# Patient Record
Sex: Male | Born: 1955 | Race: White | Hispanic: No | Marital: Married | Smoking: Former smoker
Health system: Southern US, Community
[De-identification: ages and names within clinical notes are randomized; demographics above are authoritative.]

## PROBLEM LIST (undated history)

## (undated) HISTORY — PX: EYE SURGERY: SHX253

---

## 2001-10-27 ENCOUNTER — Encounter: Admission: RE | Admit: 2001-10-27 | Discharge: 2001-10-27 | Payer: Self-pay | Admitting: Cardiology

## 2001-10-27 ENCOUNTER — Encounter: Payer: Self-pay | Admitting: Cardiology

## 2001-10-30 ENCOUNTER — Ambulatory Visit (HOSPITAL_COMMUNITY): Admission: RE | Admit: 2001-10-30 | Discharge: 2001-10-30 | Payer: Self-pay | Admitting: Cardiology

## 2005-03-07 ENCOUNTER — Ambulatory Visit: Payer: Self-pay | Admitting: Internal Medicine

## 2005-04-01 ENCOUNTER — Encounter (INDEPENDENT_AMBULATORY_CARE_PROVIDER_SITE_OTHER): Payer: Self-pay | Admitting: *Deleted

## 2005-04-01 ENCOUNTER — Ambulatory Visit: Payer: Self-pay | Admitting: Internal Medicine

## 2008-02-12 ENCOUNTER — Emergency Department (HOSPITAL_COMMUNITY): Admission: EM | Admit: 2008-02-12 | Discharge: 2008-02-12 | Payer: Self-pay | Admitting: Emergency Medicine

## 2009-06-12 ENCOUNTER — Encounter: Admission: RE | Admit: 2009-06-12 | Discharge: 2009-06-12 | Payer: Self-pay | Admitting: Occupational Medicine

## 2010-03-07 ENCOUNTER — Encounter (INDEPENDENT_AMBULATORY_CARE_PROVIDER_SITE_OTHER): Payer: Self-pay | Admitting: *Deleted

## 2010-10-02 NOTE — Letter (Signed)
Summary: Colonoscopy Date Change Letter  [facility]  [address] [place], [place] [address]   Phone: [phone]  Fax: [phone]      March 07, 2010 MRN: [id number]   Dylan Wilson [address] [place], [place]  [address]   Dear Dylan Wilson,   Previously you were recommended to have a repeat colonoscopy around this time. Your chart was recently reviewed by Dr. Hedwig Morton. Juanda Chance of  Gastroenterology. Follow up colonoscopy is now recommended in July 2013. This revised recommendation is based on current, nationally recognized guidelines for colorectal cancer screening and polyp surveillance. These guidelines are endorsed by the American Cancer Society, [employer] on Colorectal Cancer as well as numerous other major medical organizations.  Please understand that our recommendation assumes that you do not have any new symptoms such as bleeding, a change in bowel habits, anemia, or significant abdominal discomfort. If you do have any concerning GI symptoms or want to discuss the guideline recommendations, please call to arrange an office visit at your earliest convenience. Otherwise we will keep you in our reminder system and contact you 1-2 months prior to the date listed above to schedule your next colonoscopy.  Thank you,  Hedwig Morton. Juanda Chance, M.D.  [facility] [phone]

## 2011-01-18 NOTE — Cardiovascular Report (Signed)
[facility]  Patient:    RENAN, DANESE Visit Number: [id number] MRN: [id number]          Service Type: CAT Location: [facility] 2852 01 Attending Physician:  Loreli Dollar Dictated by:   Julieanne Manson, M.D. Proc. Date: 10/30/01 Admit Date:  10/30/2001   CC:         Elvina Sidle, M.D.  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  The exercise treadmill test was reported as positive with upsloping ST segment depression.  Because of this, he has been put on light duty until his cardiac status can be resolved.  PROCEDURES: 1. Left heart catheterization. 2. Selective right and left coronary arteriography. 3. Ventriculography in the RAO projection.  CARDIOLOGIST:  Thereasa Solo. Little, M.D.  COMPLICATIONS:  None.  DESCRIPTION OF PROCEDURE:  The patient was prepped and draped in the usual sterile fashion exposing the right groin, applying local anesthetic with 1% Xylocaine.  A Seldinger technique was employed and 5-French introducer sheath placed in the right femoral artery.  Selective left and right coronary arteriography and ventriculography in the RAO projection was performed.  RESULTS:  I.   HEMODYNAMIC MONITORING:  Central aortic pressure 117/72, left ventricular      pressure 120/8.   There was no significant aortic valve gradient noted at      the time of pullback.  II.  VENTRICULOGRAPHY: Ventriculography in the RAO projection revealed normal      left ventricular systolic function.  The ejection fraction was greater      than 60%.  The end-diastolic pressure was 15.  III. CORONARY ARTERIOGRAPHY:  On fluoroscope, no calcification was noted.      1. Left main normal.      2. LAD: The LAD extended down to the apex of the heart and gave rise to         one very large first  diagonal branch.  In addition to this, the septal         perforator was an extremely large vessel.  This entire system is free         of disease.      3. Circumflex: The Circumflex gave rise to a large OM that bifurcated.         This system was free of disease.      4. RCA: The RCA including the PDA and posterolateral branch was normal.  CONCLUSION: 1. Normal left ventricular systolic function. 2. No occlusive coronary disease.  DISCUSSION:  It appears that the stress test was a false positive study; 5-French catheters were used.  The patient should be ready for discharge to home in about four hours and can return to full duty in 72 hours. Dictated by:   Julieanne Manson, M.D. Attending Physician:  Loreli Dollar DD:  10/30/01 TD:  10/30/01 Job: [id number]

## 2011-03-08 ENCOUNTER — Encounter: Payer: Self-pay | Admitting: Family Medicine

## 2011-05-30 LAB — URINALYSIS, ROUTINE W REFLEX MICROSCOPIC
Bilirubin Urine: NEGATIVE
Ketones, ur: NEGATIVE
Specific Gravity, Urine: 1.028
Urobilinogen, UA: 1
pH: 6

## 2011-05-30 LAB — URINE CULTURE
Colony Count: NO GROWTH
Culture: NO GROWTH

## 2011-05-30 LAB — URINE MICROSCOPIC-ADD ON

## 2011-12-13 ENCOUNTER — Encounter: Payer: Self-pay | Admitting: Internal Medicine

## 2012-12-07 ENCOUNTER — Encounter: Payer: Self-pay | Admitting: Internal Medicine

## 2012-12-10 ENCOUNTER — Encounter: Payer: Self-pay | Admitting: Internal Medicine

## 2017-10-23 ENCOUNTER — Ambulatory Visit: Payer: Self-pay | Admitting: Family Medicine

## 2019-01-03 ENCOUNTER — Emergency Department (HOSPITAL_COMMUNITY): Payer: 59

## 2019-01-03 ENCOUNTER — Other Ambulatory Visit: Payer: Self-pay

## 2019-01-03 ENCOUNTER — Encounter (HOSPITAL_COMMUNITY): Payer: Self-pay | Admitting: Emergency Medicine

## 2019-01-03 ENCOUNTER — Emergency Department (HOSPITAL_COMMUNITY)
Admission: EM | Admit: 2019-01-03 | Discharge: 2019-01-04 | Disposition: A | Discharge status: Home / Self Care | Payer: 59 | Attending: Emergency Medicine | Admitting: Emergency Medicine

## 2019-01-03 DIAGNOSIS — Y9352 Activity, horseback riding: Secondary | ICD-10-CM | POA: Diagnosis not present

## 2019-01-03 DIAGNOSIS — S2249XA Multiple fractures of ribs, unspecified side, initial encounter for closed fracture: Secondary | ICD-10-CM

## 2019-01-03 DIAGNOSIS — S299XXA Unspecified injury of thorax, initial encounter: Secondary | ICD-10-CM | POA: Diagnosis present

## 2019-01-03 DIAGNOSIS — Y929 Unspecified place or not applicable: Secondary | ICD-10-CM | POA: Insufficient documentation

## 2019-01-03 DIAGNOSIS — S2242XA Multiple fractures of ribs, left side, initial encounter for closed fracture: Secondary | ICD-10-CM | POA: Insufficient documentation

## 2019-01-03 DIAGNOSIS — S22009A Unspecified fracture of unspecified thoracic vertebra, initial encounter for closed fracture: Secondary | ICD-10-CM | POA: Diagnosis not present

## 2019-01-03 DIAGNOSIS — R51 Headache: Secondary | ICD-10-CM | POA: Diagnosis not present

## 2019-01-03 DIAGNOSIS — S32009A Unspecified fracture of unspecified lumbar vertebra, initial encounter for closed fracture: Secondary | ICD-10-CM | POA: Insufficient documentation

## 2019-01-03 DIAGNOSIS — R1010 Upper abdominal pain, unspecified: Secondary | ICD-10-CM | POA: Insufficient documentation

## 2019-01-03 DIAGNOSIS — Y999 Unspecified external cause status: Secondary | ICD-10-CM | POA: Insufficient documentation

## 2019-01-03 DIAGNOSIS — Z87891 Personal history of nicotine dependence: Secondary | ICD-10-CM | POA: Diagnosis not present

## 2019-01-03 DIAGNOSIS — Z23 Encounter for immunization: Secondary | ICD-10-CM | POA: Insufficient documentation

## 2019-01-03 DIAGNOSIS — T1490XA Injury, unspecified, initial encounter: Secondary | ICD-10-CM

## 2019-01-03 DIAGNOSIS — W19XXXA Unspecified fall, initial encounter: Secondary | ICD-10-CM

## 2019-01-03 MED ORDER — TETANUS-DIPHTH-ACELL PERTUSSIS 5-2.5-18.5 LF-MCG/0.5 IM SUSP
0.5000 mL | Freq: Once | INTRAMUSCULAR | Status: AC
  Administered 2019-01-03: 0.5 mL via INTRAMUSCULAR
  Filled 2019-01-03: qty 0.5

## 2019-01-03 MED ORDER — FENTANYL CITRATE (PF) 100 MCG/2ML IJ SOLN
75.0000 ug | Freq: Once | INTRAMUSCULAR | Status: AC
  Administered 2019-01-03: 75 ug via INTRAVENOUS
  Filled 2019-01-03: qty 2

## 2019-01-03 NOTE — ED Triage Notes (Signed)
Brought from [facility] after being bucked off a horse trying to get on without a saddle.  Reports not remembering what happened.  C/O pain to left clavicle with deformity, abrasion to left upper arm and right rib pain that radiates into the back.

## 2019-01-03 NOTE — ED Provider Notes (Signed)
[facility] EMERGENCY DEPARTMENT Provider Note   CSN: [id number] Arrival date & time: 01/03/19  2152    History   Chief Complaint Chief Complaint  Patient presents with   Fall    HPI RODDRICK SHARRON is a 63 y.o. male with no pertinent past medical history who presents to the emergency department by EMS with a chief complaint of fall.  The patient reports that he was attempting to mount a horse without a saddle when he was bucked off of the horse that occurred just prior to arrival.  He fell backwards through the air and landed on the ground.  He reports that he was accompanied by his wife at the time and thinks that he had a syncopal episode as he does not recall the fall.  He reports the next thing he remembers is when EMS arrived.  He has not attempted to ambulate since the injury.  The patient's wife, Fannie Knee, reports that after the patient fell that he was laying on his back and moaning.  She reports that he "looked like he had the breath knocked out of him", and he was having increased work of breathing that lasted for approximately 2 minutes.  She reports that he appeared "a little clammy", which resolved as his breathing improved.  She denies LOC.  Reports when he firemen arrived that he knew his name, but when asked his age he reported0 "I do not even know."  In the ER, he endorses left shoulder pain, bilateral rib pain, upper abdominal pain, and left elbow pain.  Pain is constant and severe.  He denies numbness, weakness, visual changes, headache, nausea, vomiting, shortness of breath, back or neck pain, or pain in the bilateral lower extremities.  He was placed in a c-collar by EMS and was given 100 mcg of fentanyl in route.  He is unsure when his tetanus was last updated.  He does not take any blood thinners.     The history is provided by the patient. No language interpreter was used.    History reviewed. No pertinent past medical history.  There are no  active problems to display for this patient.   Past Surgical History:  Procedure Laterality Date   EYE SURGERY          Home Medications    Prior to Admission medications   Medication Sig Start Date End Date Taking? Authorizing Provider  lidocaine (LIDODERM) 5 % Place 1 patch onto the skin daily. Remove & Discard patch within 12 hours or as directed by MD 01/04/19   Misaki Sozio A, PA-C  methocarbamol (ROBAXIN) 500 MG tablet Take 1 tablet (500 mg total) by mouth 2 (two) times daily. 01/04/19   Ferris Tally A, PA-C  oxyCODONE-acetaminophen (PERCOCET/ROXICET) 5-325 MG tablet Take 1 tablet by mouth every 4 (four) hours as needed for up to 5 days for severe pain. 01/04/19 01/09/19  Angeliah Wisdom A, PA-C    Family History No family history on file.  Social History Social History   Tobacco Use   Smoking status: Former Smoker   Smokeless tobacco: Former Engineer, water Use Topics   Alcohol use: Never    Frequency: Never   Drug use: Never     Allergies   Patient has no known allergies.   Review of Systems Review of Systems  Constitutional: Negative for appetite change and fever.  HENT: Negative for congestion, sinus pressure and sinus pain.   Respiratory: Negative for shortness of breath.  Cardiovascular: Positive for chest pain. Negative for palpitations and leg swelling.  Gastrointestinal: Positive for abdominal pain. Negative for constipation, diarrhea, nausea and vomiting.  Genitourinary: Negative for dysuria.  Musculoskeletal: Positive for arthralgias and myalgias. Negative for back pain, joint swelling, neck pain and neck stiffness.  Skin: Negative for rash.  Allergic/Immunologic: Negative for immunocompromised state.  Neurological: Negative for dizziness, weakness, numbness and headaches.  Psychiatric/Behavioral: Negative for confusion.    Physical Exam Updated Vital Signs BP 131/85    Pulse 65    Temp 98.5 F (36.9 C) (Oral)    Resp 20    Ht  (1.778 m)     Wt 78.5 kg    SpO2 95%    BMI 24.82 kg/m   Physical Exam Vitals signs and nursing note reviewed.  Constitutional:      Appearance: He is well-developed.     Interventions: Cervical collar in place.  HENT:     Head: Normocephalic.  Eyes:     Extraocular Movements: Extraocular movements intact.     Conjunctiva/sclera: Conjunctivae normal.     Pupils: Pupils are equal, round, and reactive to light.  Neck:     Musculoskeletal: Neck supple.  Cardiovascular:     Rate and Rhythm: Normal rate and regular rhythm.     Heart sounds: No murmur.     Comments: Radial and DP pulses are 2+ and symmetric. Pulmonary:     Effort: Pulmonary effort is normal. No respiratory distress.     Breath sounds: No stridor. No wheezing, rhonchi or rales.     Comments: Significantly tender to palpation to the right inferior ribs in the anterior and lateral aspect.  He is also moderately tender diffusely to the left ribs.  No pain over the sternum.  No crepitus or step-offs.  Obvious deformity to the left clavicle.  No skin tenting. Chest:     Chest wall: Tenderness present.  Abdominal:     General: There is no distension.     Palpations: Abdomen is soft.     Tenderness: There is abdominal tenderness. There is no right CVA tenderness, left CVA tenderness, guarding or rebound.     Hernia: No hernia is present.     Comments: Tender to palpation in the left upper quadrant.  No right upper quadrant tenderness.  Lower abdomen is unremarkable.  No bruising or wounds noted to the abdominal wall.  Musculoskeletal:     Comments: No midline tenderness to the cervical spinous processes or bilateral paraspinal muscles.  Pelvis is nontender and stable.  No tenderness to palpation to the bilateral lower extremities.  Significantly tender to palpation over the left shoulder.  Left elbow is tender to palpation.  No tenderness to the left wrist.  Full active and passive range of motion of the right upper extremity.  Full range  of motion of the left wrist.  Range of motion of the left elbow and shoulder deferred secondary to pain.  Superficial abrasions overlying the left elbow and upper arm.  Wound is hemostatic.  Skin:    General: Skin is warm and dry.  Neurological:     Mental Status: He is alert.     Comments: Patient is oriented to name, year, and place.  Moves all 4 extremities.  Sensation is intact and equal throughout.  5/5 strength against resistance with dorsiflexion plantarflexion.  Good grip strength of the bilateral upper extremities.  Cranial nerves II through XII are grossly intact.  Speaks in complete, fluent sentences.  Gait exam is deferred at this time.  Psychiatric:        Behavior: Behavior normal.      ED Treatments / Results  Labs (all labs ordered are listed, but only abnormal results are displayed) Labs Reviewed  CK - Abnormal; Notable for the following components:      Result Value   Total CK 531 (*)    All other components within normal limits  CBC - Abnormal; Notable for the following components:   WBC 14.5 (*)    All other components within normal limits  COMPREHENSIVE METABOLIC PANEL - Abnormal; Notable for the following components:   Glucose, Bld 141 (*)    Calcium 8.6 (*)    Total Protein 6.3 (*)    All other components within normal limits    EKG EKG Interpretation  Date/Time:  Sunday Jan 03 2019 21:53:10 EDT Ventricular Rate:  68 PR Interval:    QRS Duration: 84 QT Interval:  392 QTC Calculation: 417 R Axis:   -23 Text Interpretation:  Sinus rhythm Borderline left axis deviation Low voltage, precordial leads Abnormal R-wave progression, early transition No old tracing to compare Confirmed by Dione Booze ([id number]) on 01/03/2019 11:26:04 PM   Radiology Dg Chest 2 View  Result Date: 01/03/2019 CLINICAL DATA:  Pain after fall. EXAM: CHEST - 2 VIEW COMPARISON:  June 12, 2009 FINDINGS: The heart size and mediastinal contours are within normal limits. Both lungs are  clear. The visualized skeletal structures are unremarkable. IMPRESSION: No active cardiopulmonary disease. Electronically Signed   By: Gerome Sam III M.D   On: 01/03/2019 23:18   Dg Clavicle Left  Result Date: 01/04/2019 CLINICAL DATA:  Left shoulder pain EXAM: LEFT CLAVICLE - 2+ VIEWS COMPARISON:  None. FINDINGS: Left AC joint appears mildly widened which could reflect AC joint separation. Glenohumeral joint is intact. No fracture. IMPRESSION: Mild widening of the left AC joint which could be related to [facility] joint separation. This could be further evaluated with bilateral AC joint study with and without weights if felt clinically indicated. Electronically Signed   By: Charlett Nose M.D.   On: 01/04/2019 00:06   Dg Elbow Complete Left  Result Date: 01/03/2019 CLINICAL DATA:  Pain after fall EXAM: LEFT ELBOW - COMPLETE 3+ VIEW COMPARISON:  None. FINDINGS: Lateral view is limited due to over flexion. No joint effusion noted. No fractures are seen. IMPRESSION: The study is limited due to over flexion on the lateral view. No fractures or dislocations identified. Electronically Signed   By: Gerome Sam III M.D   On: 01/03/2019 23:19   Ct Head Wo Contrast  Result Date: 01/03/2019 CLINICAL DATA:  Fall from horse. EXAM: CT HEAD WITHOUT CONTRAST CT CERVICAL SPINE WITHOUT CONTRAST TECHNIQUE: Multidetector CT imaging of the head and cervical spine was performed following the standard protocol without intravenous contrast. Multiplanar CT image reconstructions of the cervical spine were also generated. COMPARISON:  None. FINDINGS: CT HEAD FINDINGS Brain: No acute intracranial abnormality. Specifically, no hemorrhage, hydrocephalus, mass lesion, acute infarction, or significant intracranial injury. Vascular: No hyperdense vessel or unexpected calcification. Skull: No acute calvarial abnormality. Sinuses/Orbits: Visualized paranasal sinuses and mastoids clear. Orbital soft tissues unremarkable. Other: None CT  CERVICAL SPINE FINDINGS Alignment: Normal Skull base and vertebrae: No acute fracture. No primary bone lesion or focal pathologic process. Soft tissues and spinal canal: No prevertebral fluid or swelling. No visible canal hematoma. Disc levels:  Maintained Upper chest: Negative Other: None IMPRESSION: No intracranial abnormality. No bony abnormality  in the cervical spine. Electronically Signed   By: Charlett NoseKevin  Dover M.D.   On: 01/03/2019 22:54   Ct Chest W Contrast  Result Date: 01/04/2019 CLINICAL DATA:  Thrown from horse EXAM: CT CHEST, ABDOMEN, AND PELVIS WITH CONTRAST TECHNIQUE: Multidetector CT imaging of the chest, abdomen and pelvis was performed following the standard protocol during bolus administration of intravenous contrast. CONTRAST:  100mL OMNIPAQUE IOHEXOL 300 MG/ML  SOLN COMPARISON:  None. FINDINGS: CT CHEST FINDINGS Cardiovascular: Heart is normal size. Aorta is normal caliber. No evidence of aortic injury. Mediastinum/Nodes: No mediastinal, hilar, or axillary adenopathy. No mediastinal hematoma. Lungs/Pleura: . Lungs are clear. No focal airspace opacities or suspicious nodules. No effusions. No pneumothorax Musculoskeletal: Chest wall soft tissues are unremarkable. No acute bony abnormality. CT ABDOMEN PELVIS FINDINGS Hepatobiliary: No hepatic injury or perihepatic hematoma. Gallbladder is unremarkable Pancreas: No focal abnormality or ductal dilatation. Spleen: Small low-density lesions within the spleen, likely small cysts or hemangiomas. No splenic injury or perisplenic hematoma. Adrenals/Urinary Tract: No adrenal hemorrhage or renal injury identified. Bladder is unremarkable. Nonobstructing stones in the left kidney. No hydronephrosis. Stomach/Bowel: Normal appendix. Stomach, large and small bowel grossly unremarkable. Vascular/Lymphatic: No evidence of aneurysm or adenopathy. Reproductive: No visible focal abnormality. Other: No free fluid or free air. Musculoskeletal: No acute bony  abnormality. IMPRESSION: No acute findings or evidence of significant traumatic injury in the chest, abdomen or pelvis. Left nephrolithiasis.  No hydronephrosis. Electronically Signed   By: Charlett NoseKevin  Dover M.D.   On: 01/04/2019 02:02   Ct Cervical Spine Wo Contrast  Result Date: 01/03/2019 CLINICAL DATA:  Fall from horse. EXAM: CT HEAD WITHOUT CONTRAST CT CERVICAL SPINE WITHOUT CONTRAST TECHNIQUE: Multidetector CT imaging of the head and cervical spine was performed following the standard protocol without intravenous contrast. Multiplanar CT image reconstructions of the cervical spine were also generated. COMPARISON:  None. FINDINGS: CT HEAD FINDINGS Brain: No acute intracranial abnormality. Specifically, no hemorrhage, hydrocephalus, mass lesion, acute infarction, or significant intracranial injury. Vascular: No hyperdense vessel or unexpected calcification. Skull: No acute calvarial abnormality. Sinuses/Orbits: Visualized paranasal sinuses and mastoids clear. Orbital soft tissues unremarkable. Other: None CT CERVICAL SPINE FINDINGS Alignment: Normal Skull base and vertebrae: No acute fracture. No primary bone lesion or focal pathologic process. Soft tissues and spinal canal: No prevertebral fluid or swelling. No visible canal hematoma. Disc levels:  Maintained Upper chest: Negative Other: None IMPRESSION: No intracranial abnormality. No bony abnormality in the cervical spine. Electronically Signed   By: Charlett NoseKevin  Dover M.D.   On: 01/03/2019 22:54   Ct Abdomen Pelvis W Contrast  Result Date: 01/04/2019 CLINICAL DATA:  Thrown from horse EXAM: CT CHEST, ABDOMEN, AND PELVIS WITH CONTRAST TECHNIQUE: Multidetector CT imaging of the chest, abdomen and pelvis was performed following the standard protocol during bolus administration of intravenous contrast. CONTRAST:  100mL OMNIPAQUE IOHEXOL 300 MG/ML  SOLN COMPARISON:  None. FINDINGS: CT CHEST FINDINGS Cardiovascular: Heart is normal size. Aorta is normal caliber. No  evidence of aortic injury. Mediastinum/Nodes: No mediastinal, hilar, or axillary adenopathy. No mediastinal hematoma. Lungs/Pleura: . Lungs are clear. No focal airspace opacities or suspicious nodules. No effusions. No pneumothorax Musculoskeletal: Chest wall soft tissues are unremarkable. No acute bony abnormality. CT ABDOMEN PELVIS FINDINGS Hepatobiliary: No hepatic injury or perihepatic hematoma. Gallbladder is unremarkable Pancreas: No focal abnormality or ductal dilatation. Spleen: Small low-density lesions within the spleen, likely small cysts or hemangiomas. No splenic injury or perisplenic hematoma. Adrenals/Urinary Tract: No adrenal hemorrhage or renal injury identified. Bladder is  unremarkable. Nonobstructing stones in the left kidney. No hydronephrosis. Stomach/Bowel: Normal appendix. Stomach, large and small bowel grossly unremarkable. Vascular/Lymphatic: No evidence of aneurysm or adenopathy. Reproductive: No visible focal abnormality. Other: No free fluid or free air. Musculoskeletal: No acute bony abnormality. IMPRESSION: No acute findings or evidence of significant traumatic injury in the chest, abdomen or pelvis. Left nephrolithiasis.  No hydronephrosis. Electronically Signed   By: Charlett Nose M.D.   On: 01/04/2019 02:02   Ct T-spine No Charge  Result Date: 01/04/2019 CLINICAL DATA:  63 y/o  M; pain to back after fall from horse. EXAM: CT THORACIC AND LUMBAR SPINE WITHOUT CONTRAST TECHNIQUE: Multidetector CT imaging of the thoracic and lumbar spine was performed without contrast. Multiplanar CT image reconstructions were also generated. COMPARISON:  01/04/2019 CT of chest, abdomen, and pelvis. FINDINGS: CT THORACIC SPINE FINDINGS Alignment: Normal. Vertebrae: Left ribs 1-3 mildly displaced neck fractures. Left T2 and T3 transverse process nondisplaced fractures. No additional potential fracture identified. Paraspinal and other soft tissues: Negative. Disc levels: Negative. CT LUMBAR SPINE  FINDINGS Segmentation: 5 lumbar type vertebrae. Alignment: Normal. Vertebrae: Age-indeterminate minimal depression of the right superior endplate of L2 vertebral body. No additional potential fracture. Paraspinal and other soft tissues: Negative. Disc levels: Negative. IMPRESSION: 1. Left 1-3 rib mildly displaced neck fractures. 2. Left T2 and T3 transverse process nondisplaced fractures. 3. Age-indeterminate minimal depression of the right superior endplate of L2 vertebral body. 4. No additional potential acute fracture or dislocation identified. 5. Left-sided nephrolithiasis. These results were called by telephone at the time of interpretation on 01/04/2019 at 2:17 am to Dr. Pedro Earls [facility] , who verbally acknowledged these results. Electronically Signed   By: Mitzi Hansen M.D.   On: 01/04/2019 02:19   Ct L-spine No Charge  Result Date: 01/04/2019 CLINICAL DATA:  63 y/o  M; pain to back after fall from horse. EXAM: CT THORACIC AND LUMBAR SPINE WITHOUT CONTRAST TECHNIQUE: Multidetector CT imaging of the thoracic and lumbar spine was performed without contrast. Multiplanar CT image reconstructions were also generated. COMPARISON:  01/04/2019 CT of chest, abdomen, and pelvis. FINDINGS: CT THORACIC SPINE FINDINGS Alignment: Normal. Vertebrae: Left ribs 1-3 mildly displaced neck fractures. Left T2 and T3 transverse process nondisplaced fractures. No additional potential fracture identified. Paraspinal and other soft tissues: Negative. Disc levels: Negative. CT LUMBAR SPINE FINDINGS Segmentation: 5 lumbar type vertebrae. Alignment: Normal. Vertebrae: Age-indeterminate minimal depression of the right superior endplate of L2 vertebral body. No additional potential fracture. Paraspinal and other soft tissues: Negative. Disc levels: Negative. IMPRESSION: 1. Left 1-3 rib mildly displaced neck fractures. 2. Left T2 and T3 transverse process nondisplaced fractures. 3. Age-indeterminate minimal depression of the right  superior endplate of L2 vertebral body. 4. No additional potential acute fracture or dislocation identified. 5. Left-sided nephrolithiasis. These results were called by telephone at the time of interpretation on 01/04/2019 at 2:17 am to Dr. Pedro Earls [facility] , who verbally acknowledged these results. Electronically Signed   By: Mitzi Hansen M.D.   On: 01/04/2019 02:19   Dg Shoulder Left  Result Date: 01/03/2019 CLINICAL DATA:  Pain after fall EXAM: LEFT SHOULDER - 2+ VIEW COMPARISON:  None. FINDINGS: There is no evidence of fracture or dislocation. There is no evidence of arthropathy or other focal bone abnormality. Soft tissues are unremarkable. IMPRESSION: Negative. Electronically Signed   By: Gerome Sam III M.D   On: 01/03/2019 23:18    Procedures Procedures (including critical care time)  Medications Ordered in ED Medications  fentaNYL (  SUBLIMAZE) injection 75 mcg (75 mcg Intravenous Given 01/03/19 2332)  Tdap (BOOSTRIX) injection 0.5 mL (0.5 mLs Intramuscular Given 01/03/19 2335)  sodium chloride 0.9 % bolus 1,000 mL (0 mLs Intravenous Stopped 01/04/19 0230)  iohexol (OMNIPAQUE) 300 MG/ML solution 100 mL (100 mLs Intravenous Contrast Given 01/04/19 0101)  fentaNYL (SUBLIMAZE) injection 75 mcg (75 mcg Intravenous Given 01/04/19 0240)  oxyCODONE-acetaminophen (PERCOCET/ROXICET) 5-325 MG per tablet 1 tablet (1 tablet Oral Given 01/04/19 0427)     Initial Impression / Assessment and Plan / ED Course  I have reviewed the triage vital signs and the nursing notes.  Pertinent labs & imaging results that were available during my care of the patient were reviewed by me and considered in my medical decision making (see chart for details).  63 year old male with no pertinent past medical history presenting by EMS after he was bucked off of a horse.  The patient was uncertain if he had a syncopal episode, but the event was witnessed by his wife who reports that he did not syncopized.  He was initially  confused, but this is significantly improved since the injury.  The patient was seen and independently evaluated by Dr. Preston Fleeting, attending physician.  On exam, he is tender to palpation over the left shoulder and bilateral ribs he is also endorsing some left elbow pain.  He also has some tenderness palpation of the left upper quadrant.  Will order images and basic labs.  He will likely require a CT of the abdomen and pelvis given left upper quadrant pain.  He has no right upper quadrant pain to suggest liver laceration.  No obvious wounds to the abdominal wall.  Tdap updated today.  Clinical Course as of Jan 03 706  Mon Jan 04, 2019  0300 Discussed imaging results with the patient.  He reports the pain is currently well controlled, but just received a dose of IV fentanyl.  Will order incentive spirometer and plan to ambulate the patient in about 30 minutes after fentanyl has started to metabolize to assess the patient's pain control.  We discussed admission for pain control, but the patient would prefer to go home if pain is reasonably controlled.  Spoke with the patient's wife Fannie Knee regarding imaging findings.  All questions were answered.   [MM]    Clinical Course User Index [MM] Nataki Mccrumb A, PA-C   Labs are notable for CK of 531.  This was treated with IV fluids in the ER.  Leukocytosis of 14.5, likely secondary to recent trauma.  CT demonstrating left mildly displaced neck fractures of ribs 1 through 3.  There are also a left T2 and T3 transverse process nondisplaced fractures.  There is also an age-indeterminate minimal depression of the right superior endplate of L2 vertebral body.    Left shoulder x-ray with possible AC joint separation.  It is uncertain if this is acute or chronic as he reports a history of previous pain in the left shoulder and left shoulder blade.  These findings were discussed with the patient and his wife via phone.  Pain has been well controlled with fentanyl in the ER.   Shared decision making conversation.  The patient was offered admission for pain control given numerous fractures versus home with pain control and outpatient follow-up.  The patient reported that he preferred to go home.  He was given an incentive spirometer for rib fractures to prevent pneumonia.  Prior to discharge, the patient was ambulated in the ER and reports pain is  controlled.  Will discharge home with Percocet, neurosurgery follow-up, and follow-up with PCP for rib fractures possible concussion given initial confusion that is since resolved.  He was given return precautions to the ER.  He is hemodynamically stable and in no acute distress.  Safe for discharge home with outpatient follow-up this time.     Final Clinical Impressions(s) / ED Diagnoses   Final diagnoses:  Fall  Fall from horse, initial encounter  Multiple rib fractures involving first rib  Closed fracture of transverse process of thoracic vertebra, initial encounter (HCC)  Closed fracture of lumbar vertebral body [facility])    ED Discharge Orders         Ordered    oxyCODONE-acetaminophen (PERCOCET/ROXICET) 5-325 MG tablet  Every 4 hours PRN     01/04/19 0418    methocarbamol (ROBAXIN) 500 MG tablet  2 times daily     01/04/19 0418    lidocaine (LIDODERM) 5 %  Every 24 hours     01/04/19 0418           Barkley Boards, PA-C 01/04/19 1610    Dione Booze, MD 01/04/19 [phone]

## 2019-01-04 ENCOUNTER — Emergency Department (HOSPITAL_COMMUNITY): Payer: 59

## 2019-01-04 LAB — CBC
HCT: 43.7 % (ref 39.0–52.0)
Hemoglobin: 14.7 g/dL (ref 13.0–17.0)
MCH: 30.4 pg (ref 26.0–34.0)
MCHC: 33.6 g/dL (ref 30.0–36.0)
MCV: 90.3 fL (ref 80.0–100.0)
Platelets: 270 10*3/uL (ref 150–400)
RBC: 4.84 MIL/uL (ref 4.22–5.81)
RDW: 12.7 % (ref 11.5–15.5)
WBC: 14.5 10*3/uL — ABNORMAL HIGH (ref 4.0–10.5)
nRBC: 0 % (ref 0.0–0.2)

## 2019-01-04 LAB — COMPREHENSIVE METABOLIC PANEL
ALT: 18 U/L (ref 0–44)
AST: 24 U/L (ref 15–41)
Albumin: 3.7 g/dL (ref 3.5–5.0)
Alkaline Phosphatase: 46 U/L (ref 38–126)
Anion gap: 8 (ref 5–15)
BUN: 21 mg/dL (ref 8–23)
CO2: 24 mmol/L (ref 22–32)
Calcium: 8.6 mg/dL — ABNORMAL LOW (ref 8.9–10.3)
Chloride: 104 mmol/L (ref 98–111)
Creatinine, Ser: 1.15 mg/dL (ref 0.61–1.24)
GFR calc Af Amer: >60 mL/min (ref >60)
GFR calc non Af Amer: >60 mL/min (ref >60)
Glucose, Bld: 141 mg/dL — ABNORMAL HIGH (ref 70–99)
Potassium: 3.8 mmol/L (ref 3.5–5.1)
Sodium: 136 mmol/L (ref 135–145)
Total Bilirubin: 0.9 mg/dL (ref 0.3–1.2)
Total Protein: 6.3 g/dL — ABNORMAL LOW (ref 6.5–8.1)

## 2019-01-04 LAB — CK: Total CK: 531 U/L — ABNORMAL HIGH (ref 49–397)

## 2019-01-04 MED ORDER — IOHEXOL 300 MG/ML  SOLN
100.0000 mL | Freq: Once | INTRAMUSCULAR | Status: AC | PRN
  Administered 2019-01-04: 100 mL via INTRAVENOUS

## 2019-01-04 MED ORDER — OXYCODONE-ACETAMINOPHEN 5-325 MG PO TABS
1.0000 | ORAL_TABLET | Freq: Once | ORAL | Status: AC
  Administered 2019-01-04: 04:00:00 1 via ORAL
  Filled 2019-01-04: qty 1

## 2019-01-04 MED ORDER — SODIUM CHLORIDE 0.9 % IV BOLUS
1000.0000 mL | Freq: Once | INTRAVENOUS | Status: AC
Start: 2019-01-04 — End: 2019-01-04
  Administered 2019-01-04: 1000 mL via INTRAVENOUS

## 2019-01-04 MED ORDER — OXYCODONE-ACETAMINOPHEN 5-325 MG PO TABS: 1.0000 | ORAL_TABLET | ORAL | 0 refills | Status: AC | PRN

## 2019-01-04 MED ORDER — METHOCARBAMOL 500 MG PO TABS: 500.0000 mg | ORAL_TABLET | Freq: Two times a day (BID) | ORAL | 0 refills | Status: DC

## 2019-01-04 MED ORDER — FENTANYL CITRATE (PF) 100 MCG/2ML IJ SOLN
75.0000 ug | Freq: Once | INTRAMUSCULAR | Status: AC
  Administered 2019-01-04: 75 ug via INTRAVENOUS
  Filled 2019-01-04: qty 2

## 2019-01-04 MED ORDER — LIDOCAINE 5 % EX PTCH: 1.0000 | MEDICATED_PATCH | CUTANEOUS | 0 refills | Status: DC

## 2019-01-04 NOTE — ED Notes (Signed)
Patient transported to CT 

## 2019-01-04 NOTE — ED Notes (Signed)
Discharge instructions discussed with pt. Pt verbalized understanding. Pt stable and ambulatory. No signature pad available. 

## 2019-01-04 NOTE — ED Notes (Signed)
RN walked pt approximately 150 ft. Pt tolerated well. Did not report an increase in pain.

## 2019-01-04 NOTE — ED Notes (Signed)
Incentive spirometer done x5, achieved 2500 each time
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
## 2019-12-16 DIAGNOSIS — E785 Hyperlipidemia, unspecified: Secondary | ICD-10-CM | POA: Diagnosis not present

## 2019-12-16 DIAGNOSIS — Z0000 Encounter for general adult medical examination without abnormal findings: Secondary | ICD-10-CM | POA: Diagnosis not present

## 2019-12-16 DIAGNOSIS — I10 Essential (primary) hypertension: Secondary | ICD-10-CM | POA: Diagnosis not present

## 2019-12-16 DIAGNOSIS — Z79899 Other long term (current) drug therapy: Secondary | ICD-10-CM | POA: Diagnosis not present

## 2019-12-16 DIAGNOSIS — Z1159 Encounter for screening for other viral diseases: Secondary | ICD-10-CM | POA: Diagnosis not present

## 2020-02-13 ENCOUNTER — Emergency Department (HOSPITAL_COMMUNITY): Payer: PPO

## 2020-02-13 ENCOUNTER — Emergency Department (HOSPITAL_COMMUNITY)
Admission: EM | Admit: 2020-02-13 | Discharge: 2020-02-14 | Disposition: A | Discharge status: Home / Self Care | Payer: PPO | Attending: Emergency Medicine | Admitting: Emergency Medicine

## 2020-02-13 ENCOUNTER — Other Ambulatory Visit: Payer: Self-pay

## 2020-02-13 ENCOUNTER — Encounter (HOSPITAL_COMMUNITY): Payer: Self-pay | Admitting: Emergency Medicine

## 2020-02-13 DIAGNOSIS — Y939 Activity, unspecified: Secondary | ICD-10-CM | POA: Insufficient documentation

## 2020-02-13 DIAGNOSIS — Z87891 Personal history of nicotine dependence: Secondary | ICD-10-CM | POA: Insufficient documentation

## 2020-02-13 DIAGNOSIS — Y929 Unspecified place or not applicable: Secondary | ICD-10-CM | POA: Insufficient documentation

## 2020-02-13 DIAGNOSIS — S01319A Laceration without foreign body of unspecified ear, initial encounter: Secondary | ICD-10-CM

## 2020-02-13 DIAGNOSIS — S065X0A Traumatic subdural hemorrhage without loss of consciousness, initial encounter: Secondary | ICD-10-CM | POA: Insufficient documentation

## 2020-02-13 DIAGNOSIS — Y999 Unspecified external cause status: Secondary | ICD-10-CM | POA: Diagnosis not present

## 2020-02-13 DIAGNOSIS — S01312A Laceration without foreign body of left ear, initial encounter: Secondary | ICD-10-CM | POA: Diagnosis not present

## 2020-02-13 DIAGNOSIS — S065XAA Traumatic subdural hemorrhage with loss of consciousness status unknown, initial encounter: Secondary | ICD-10-CM

## 2020-02-13 DIAGNOSIS — S40211A Abrasion of right shoulder, initial encounter: Secondary | ICD-10-CM | POA: Diagnosis not present

## 2020-02-13 DIAGNOSIS — T07XXXA Unspecified multiple injuries, initial encounter: Secondary | ICD-10-CM | POA: Diagnosis present

## 2020-02-13 DIAGNOSIS — S199XXA Unspecified injury of neck, initial encounter: Secondary | ICD-10-CM | POA: Diagnosis not present

## 2020-02-13 DIAGNOSIS — S01322A Laceration with foreign body of left ear, initial encounter: Secondary | ICD-10-CM | POA: Diagnosis not present

## 2020-02-13 LAB — CBC WITH DIFFERENTIAL/PLATELET
Abs Immature Granulocytes: 0.09 10*3/uL — ABNORMAL HIGH (ref 0.00–0.07)
Basophils Absolute: 0.1 10*3/uL (ref 0.0–0.1)
Basophils Relative: 0 %
Eosinophils Absolute: 0 10*3/uL (ref 0.0–0.5)
Eosinophils Relative: 0 %
HCT: 49.8 % (ref 39.0–52.0)
Hemoglobin: 16.4 g/dL (ref 13.0–17.0)
Immature Granulocytes: 1 %
Lymphocytes Relative: 9 %
Lymphs Abs: 1.4 10*3/uL (ref 0.7–4.0)
MCH: 30.1 pg (ref 26.0–34.0)
MCHC: 32.9 g/dL (ref 30.0–36.0)
MCV: 91.4 fL (ref 80.0–100.0)
Monocytes Absolute: 0.7 10*3/uL (ref 0.1–1.0)
Monocytes Relative: 5 %
Neutro Abs: 12.7 10*3/uL — ABNORMAL HIGH (ref 1.7–7.7)
Neutrophils Relative %: 85 %
Platelets: 311 10*3/uL (ref 150–400)
RBC: 5.45 MIL/uL (ref 4.22–5.81)
RDW: 12.7 % (ref 11.5–15.5)
WBC: 14.9 10*3/uL — ABNORMAL HIGH (ref 4.0–10.5)
nRBC: 0 % (ref 0.0–0.2)

## 2020-02-13 LAB — COMPREHENSIVE METABOLIC PANEL
ALT: 23 U/L (ref 0–44)
AST: 22 U/L (ref 15–41)
Albumin: 4.2 g/dL (ref 3.5–5.0)
Alkaline Phosphatase: 51 U/L (ref 38–126)
Anion gap: 9 (ref 5–15)
BUN: 16 mg/dL (ref 8–23)
CO2: 27 mmol/L (ref 22–32)
Calcium: 9.5 mg/dL (ref 8.9–10.3)
Chloride: 105 mmol/L (ref 98–111)
Creatinine, Ser: 1.25 mg/dL — ABNORMAL HIGH (ref 0.61–1.24)
GFR calc Af Amer: >60 mL/min (ref >60)
GFR calc non Af Amer: >60 mL/min (ref >60)
Glucose, Bld: 135 mg/dL — ABNORMAL HIGH (ref 70–99)
Potassium: 4.6 mmol/L (ref 3.5–5.1)
Sodium: 141 mmol/L (ref 135–145)
Total Bilirubin: 1 mg/dL (ref 0.3–1.2)
Total Protein: 7.1 g/dL (ref 6.5–8.1)

## 2020-02-13 LAB — PROTIME-INR
INR: 1 (ref 0.8–1.2)
Prothrombin Time: 13 seconds (ref 11.4–15.2)
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
## 2020-02-14 MED ORDER — LIDOCAINE HCL 2 % IJ SOLN
10.0000 mL | Freq: Once | INTRAMUSCULAR | Status: AC
  Administered 2020-02-14: 200 mg
  Filled 2020-02-14: qty 20

## 2020-02-14 MED ORDER — DOXYCYCLINE HYCLATE 100 MG PO CAPS: 100.0000 mg | ORAL_CAPSULE | Freq: Two times a day (BID) | ORAL | 0 refills | Status: AC
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
## 2020-02-18 ENCOUNTER — Encounter: Payer: Self-pay | Admitting: Surgical

## 2020-02-18 ENCOUNTER — Other Ambulatory Visit: Payer: Self-pay

## 2020-02-18 ENCOUNTER — Ambulatory Visit: Payer: PPO | Admitting: Surgical

## 2020-02-18 VITALS — BP 144/81 | HR 56 | Temp 98.0°F

## 2020-02-18 DIAGNOSIS — S01312A Laceration without foreign body of left ear, initial encounter: Secondary | ICD-10-CM | POA: Diagnosis not present
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
## 2020-02-25 ENCOUNTER — Ambulatory Visit (INDEPENDENT_AMBULATORY_CARE_PROVIDER_SITE_OTHER): Payer: PPO | Admitting: Plastic Surgery

## 2020-02-25 ENCOUNTER — Encounter: Payer: Self-pay | Admitting: Plastic Surgery

## 2020-02-25 ENCOUNTER — Other Ambulatory Visit: Payer: Self-pay

## 2020-02-25 VITALS — BP 138/74 | HR 44 | Temp 97.1°F

## 2020-02-25 DIAGNOSIS — S01312D Laceration without foreign body of left ear, subsequent encounter: Secondary | ICD-10-CM | POA: Diagnosis not present

## 2020-04-17 DIAGNOSIS — Z1159 Encounter for screening for other viral diseases: Secondary | ICD-10-CM | POA: Diagnosis not present

## 2020-04-17 DIAGNOSIS — E785 Hyperlipidemia, unspecified: Secondary | ICD-10-CM | POA: Diagnosis not present

## 2020-04-17 DIAGNOSIS — Z131 Encounter for screening for diabetes mellitus: Secondary | ICD-10-CM | POA: Diagnosis not present

## 2020-04-17 DIAGNOSIS — R5383 Other fatigue: Secondary | ICD-10-CM | POA: Diagnosis not present

## 2020-04-17 DIAGNOSIS — I10 Essential (primary) hypertension: Secondary | ICD-10-CM | POA: Diagnosis not present

## 2020-04-17 DIAGNOSIS — E559 Vitamin D deficiency, unspecified: Secondary | ICD-10-CM | POA: Diagnosis not present

## 2020-06-17 ENCOUNTER — Ambulatory Visit: Payer: PPO | Attending: Internal Medicine

## 2020-06-17 DIAGNOSIS — Z23 Encounter for immunization: Secondary | ICD-10-CM

## 2020-06-17 NOTE — Progress Notes (Signed)
° °  Covid-19 Vaccination Clinic  Name:  Dylan Wilson    MRN: [id number] DOB: [birth year]  06/17/2020  [name] was observed post Covid-19 immunization for 15 minutes without incident. He was provided with Vaccine Information Sheet and instruction to access the V-Safe system.   [name] was instructed to call 911 with any severe reactions post vaccine:  Difficulty breathing   Swelling of face and throat   A fast heartbeat   A bad rash all over body   Dizziness and weakness

## 2020-07-03 DIAGNOSIS — H5203 Hypermetropia, bilateral: Secondary | ICD-10-CM | POA: Diagnosis not present

## 2020-07-03 DIAGNOSIS — H2513 Age-related nuclear cataract, bilateral: Secondary | ICD-10-CM | POA: Diagnosis not present

## 2020-08-17 DIAGNOSIS — E785 Hyperlipidemia, unspecified: Secondary | ICD-10-CM | POA: Diagnosis not present

## 2020-08-17 DIAGNOSIS — I10 Essential (primary) hypertension: Secondary | ICD-10-CM | POA: Diagnosis not present

## 2020-08-17 DIAGNOSIS — E559 Vitamin D deficiency, unspecified: Secondary | ICD-10-CM | POA: Diagnosis not present

## 2020-08-17 DIAGNOSIS — Z79899 Other long term (current) drug therapy: Secondary | ICD-10-CM | POA: Diagnosis not present

## 2020-11-15 DIAGNOSIS — Z1331 Encounter for screening for depression: Secondary | ICD-10-CM | POA: Diagnosis not present

## 2020-11-15 DIAGNOSIS — I10 Essential (primary) hypertension: Secondary | ICD-10-CM | POA: Diagnosis not present

## 2020-11-15 DIAGNOSIS — E559 Vitamin D deficiency, unspecified: Secondary | ICD-10-CM | POA: Diagnosis not present

## 2020-11-15 DIAGNOSIS — Z1339 Encounter for screening examination for other mental health and behavioral disorders: Secondary | ICD-10-CM | POA: Diagnosis not present

## 2020-11-15 DIAGNOSIS — R5383 Other fatigue: Secondary | ICD-10-CM | POA: Diagnosis not present

## 2020-11-15 DIAGNOSIS — Z79899 Other long term (current) drug therapy: Secondary | ICD-10-CM | POA: Diagnosis not present

## 2021-02-21 DIAGNOSIS — R5383 Other fatigue: Secondary | ICD-10-CM | POA: Diagnosis not present

## 2021-02-21 DIAGNOSIS — Z79899 Other long term (current) drug therapy: Secondary | ICD-10-CM | POA: Diagnosis not present

## 2021-02-21 DIAGNOSIS — E785 Hyperlipidemia, unspecified: Secondary | ICD-10-CM | POA: Diagnosis not present

## 2021-02-21 DIAGNOSIS — E559 Vitamin D deficiency, unspecified: Secondary | ICD-10-CM | POA: Diagnosis not present

## 2021-02-21 DIAGNOSIS — Z0000 Encounter for general adult medical examination without abnormal findings: Secondary | ICD-10-CM | POA: Diagnosis not present

## 2021-02-21 DIAGNOSIS — I10 Essential (primary) hypertension: Secondary | ICD-10-CM | POA: Diagnosis not present

## 2021-03-31 IMAGING — CT CT CERVICAL SPINE W/O CM
3 of 4 series · 12 of 33 positions shown, 14 images · non-contrast
Comparison: Three hundred twenty

CLINICAL DATA: Fall from horse

EXAM:
CT HEAD WITHOUT CONTRAST
CT CERVICAL SPINE WITHOUT CONTRAST
TECHNIQUE: Multidetector CT imaging of the head and cervical spine was
performed following the standard protocol without intravenous
contrast. Multiplanar CT image reconstructions of the cervical spine
were also generated.

[Series 5: c_spine 2.0 st · axial · 0.35mm/px · z∈[-254,-106]mm · 4 of 104 slices shown, 5 images]
[im 15/104  soft-tissue]
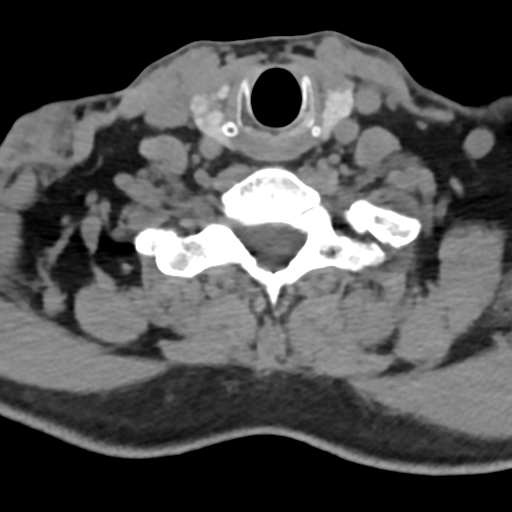
[im 15/104  bone]
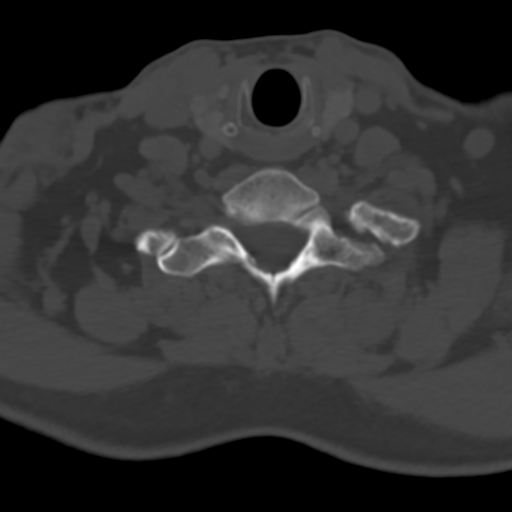
[im 45/104  bone]
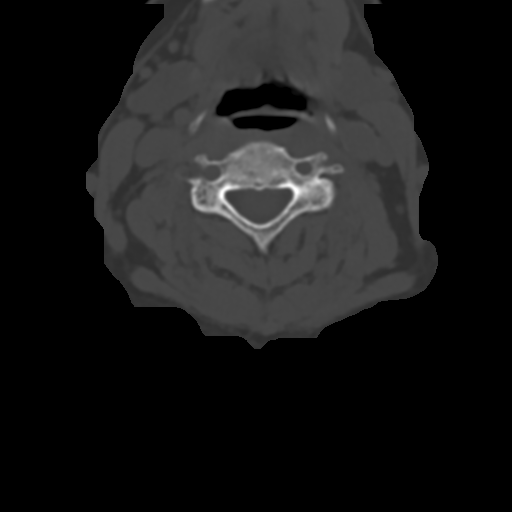
[im 59/104  bone]
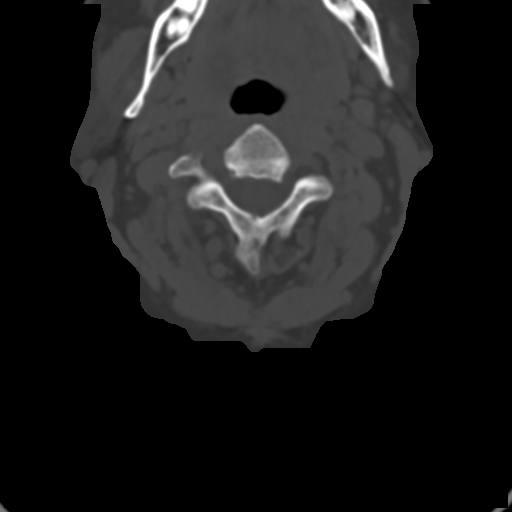
[im 89/104  bone]
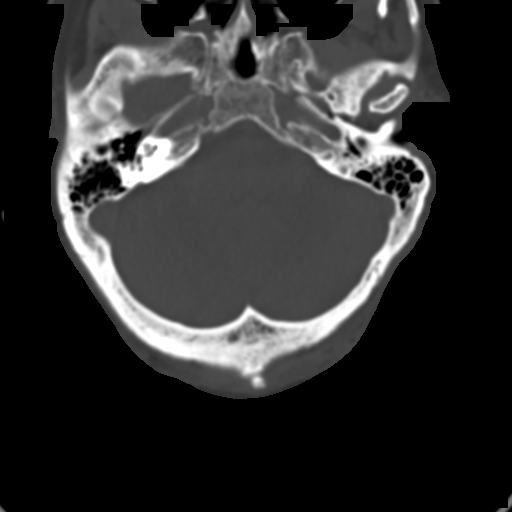

[Series 6: coronal bone · coronal · 0.26mm/px · 3 of 71 slices shown]
[im 15/71  bone]
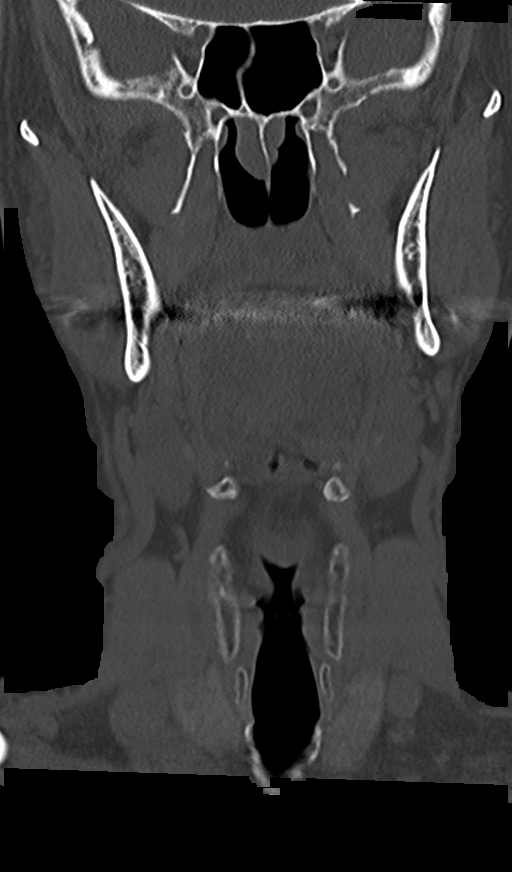
[im 29/71  bone]
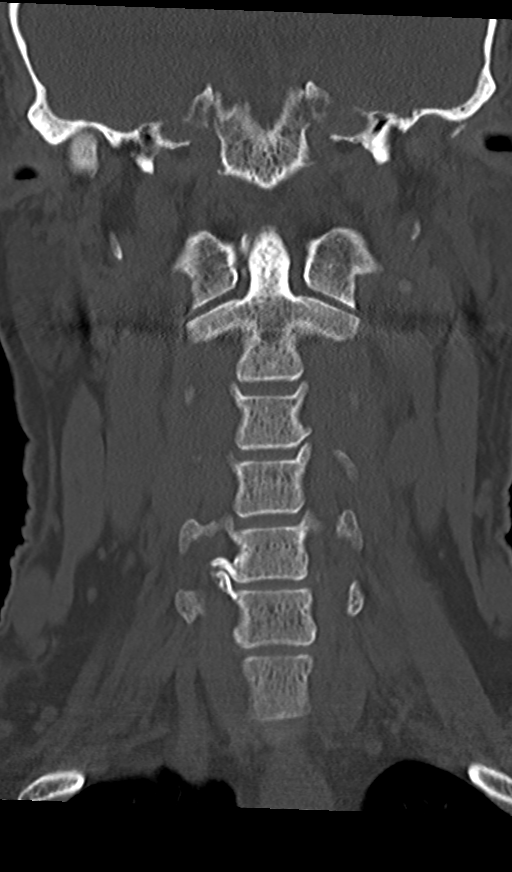
[im 43/71  bone]
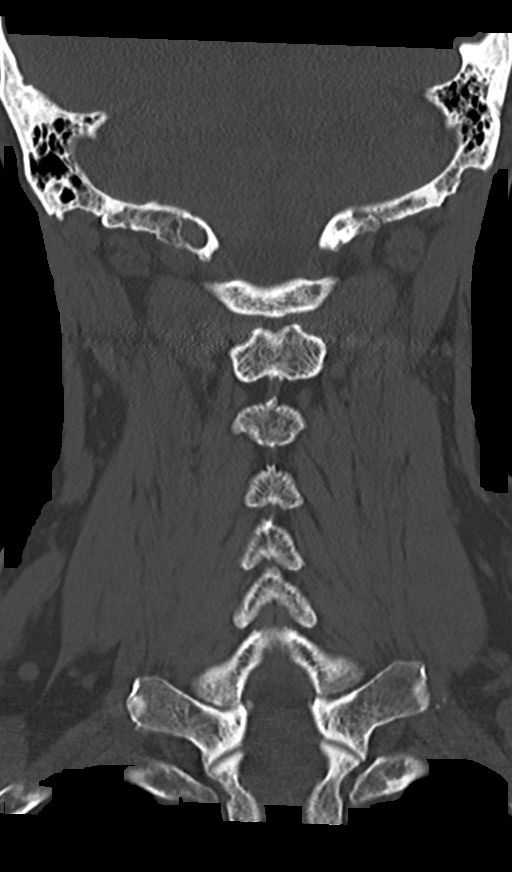

[Series 7: sagittal bone · sagittal · 0.28mm/px · 5 of 86 slices shown, 6 images]
[im 29/86  bone]
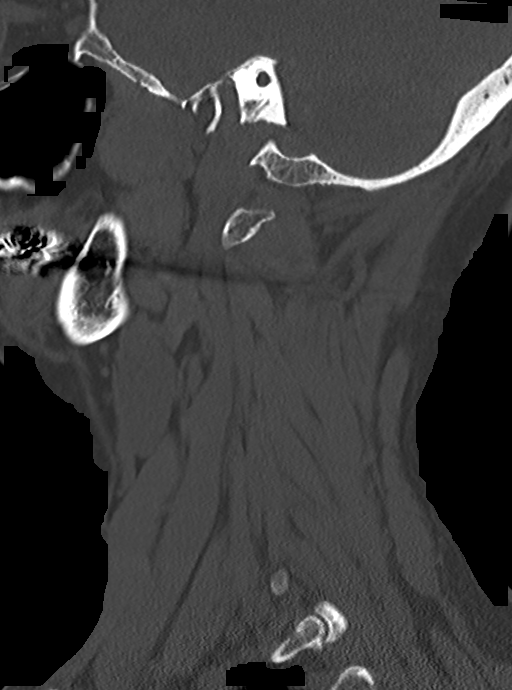
[im 36/86  bone]
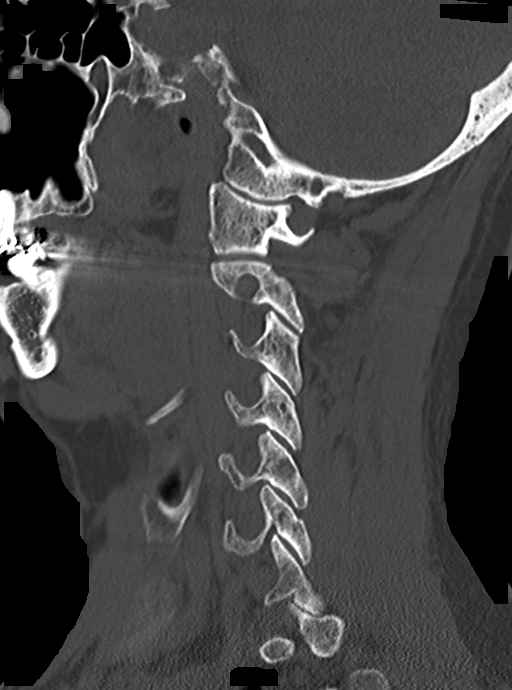
[im 43/86  soft-tissue]
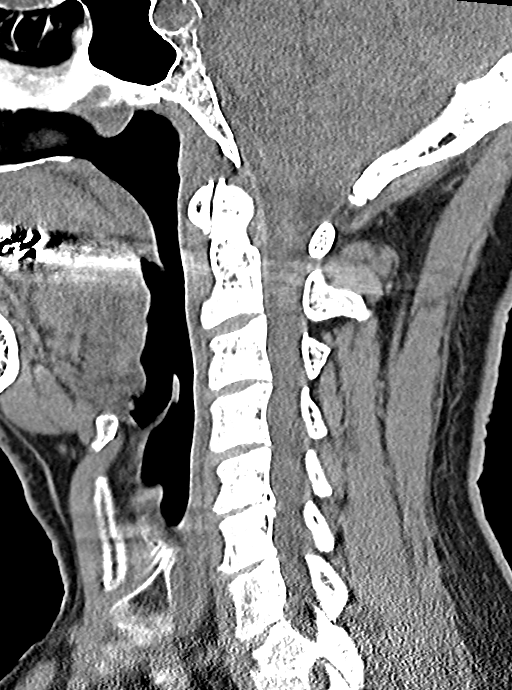
[im 43/86  bone]
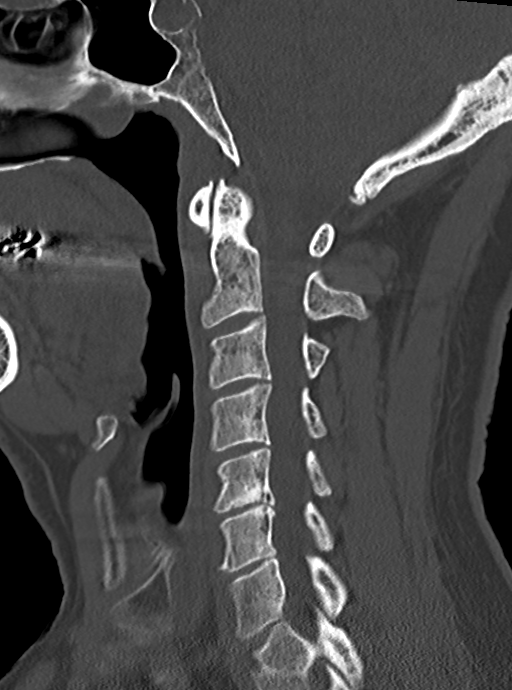
[im 50/86  bone]
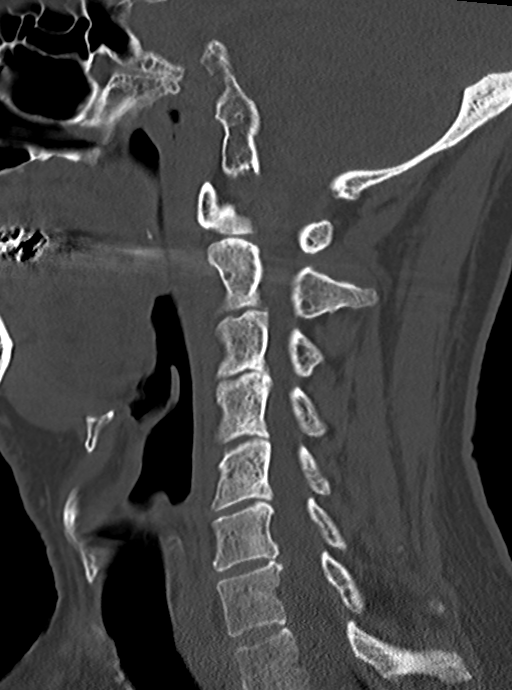
[im 57/86  bone]
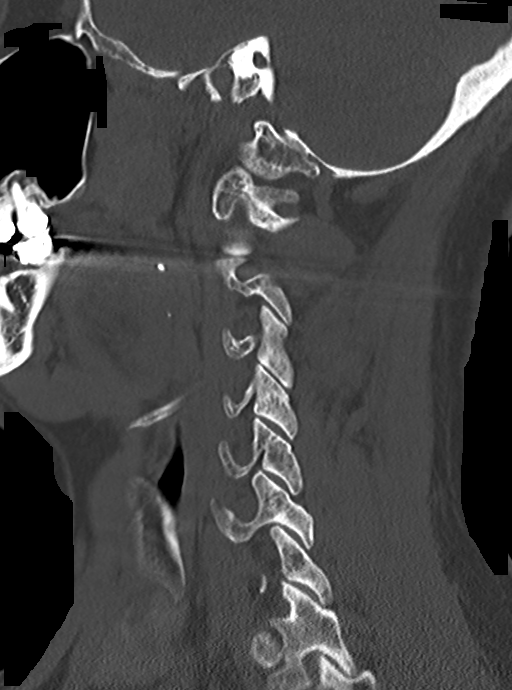

[12 of 33 positions shown; findings below may reference images not displayed]

FINDINGS: CT HEAD FINDINGS

Brain: There is a thin area subdural blood along the anterior falx
cerebri. This measures 3 mm in thickness. The size and configuration
of the ventricles and extra-axial CSF spaces are normal. The brain
parenchyma is normal, without evidence of acute or chronic
infarction.

Vascular: No abnormal hyperdensity of the major intracranial
arteries or dural venous sinuses. No intracranial atherosclerosis.

Skull: The visualized skull base, calvarium and extracranial soft
tissues are normal.

Sinuses/Orbits: No fluid levels or advanced mucosal thickening of
the visualized paranasal sinuses. No mastoid or middle ear effusion.
The orbits are normal.

CT CERVICAL SPINE FINDINGS

Alignment: No static subluxation. Facets are aligned. Occipital
condyles are normally positioned.

Skull base and vertebrae: No acute fracture.

Soft tissues and spinal canal: No prevertebral fluid or swelling. No
visible canal hematoma.

Disc levels: No advanced spinal canal or neural foraminal stenosis.

Upper chest: No pneumothorax, pulmonary nodule or pleural effusion.

Other: Normal visualized paraspinal cervical soft tissues.

Other:
IMPRESSION: 1. Thin subdural hematoma along the anterior falx cerebri without
mass effect.
2. No acute fracture or static subluxation of the cervical spine.

Critical Value/emergent results were called by telephone at the time
of interpretation on 02/13/2020 at [DATE] to provider TAMILVANAN VAKEEL
, who verbally acknowledged these results.

## 2021-05-29 DIAGNOSIS — E559 Vitamin D deficiency, unspecified: Secondary | ICD-10-CM | POA: Diagnosis not present

## 2021-05-29 DIAGNOSIS — Z79899 Other long term (current) drug therapy: Secondary | ICD-10-CM | POA: Diagnosis not present

## 2021-05-29 DIAGNOSIS — E785 Hyperlipidemia, unspecified: Secondary | ICD-10-CM | POA: Diagnosis not present

## 2021-05-29 DIAGNOSIS — I10 Essential (primary) hypertension: Secondary | ICD-10-CM | POA: Diagnosis not present

## 2021-07-30 DIAGNOSIS — H2513 Age-related nuclear cataract, bilateral: Secondary | ICD-10-CM | POA: Diagnosis not present

## 2021-07-30 DIAGNOSIS — H5203 Hypermetropia, bilateral: Secondary | ICD-10-CM | POA: Diagnosis not present

## 2021-07-30 DIAGNOSIS — H524 Presbyopia: Secondary | ICD-10-CM | POA: Diagnosis not present

## 2021-09-12 ENCOUNTER — Other Ambulatory Visit (HOSPITAL_BASED_OUTPATIENT_CLINIC_OR_DEPARTMENT_OTHER): Payer: Self-pay

## 2021-09-12 ENCOUNTER — Ambulatory Visit: Payer: PPO | Attending: Internal Medicine

## 2021-09-12 DIAGNOSIS — Z23 Encounter for immunization: Secondary | ICD-10-CM

## 2021-09-12 MED ORDER — PFIZER COVID-19 VAC BIVALENT 30 MCG/0.3ML IM SUSP
INTRAMUSCULAR | 0 refills | Status: AC
  Filled 2021-09-12: qty 0.3, 1d supply, fill #0

## 2021-09-12 NOTE — Progress Notes (Signed)
° °  Covid-19 Vaccination Clinic  Name:  Dylan Wilson    MRN: [id number] DOB: 05/21/1956  09/12/2021  Mr. Sprigg was observed post Covid-19 immunization for 15 minutes without incident. He was provided with Vaccine Information Sheet and instruction to access the V-Safe system.   Mr. Twist was instructed to call 911 with any severe reactions post vaccine: Difficulty breathing  Swelling of face and throat  A fast heartbeat  A bad rash all over body  Dizziness and weakness   Immunizations Administered     Name Date Dose VIS Date Route   Pfizer Covid-19 Vaccine Bivalent Booster 09/12/2021 11:15 AM 0.3 mL 05/02/2021 Intramuscular   Manufacturer: [employer], [employer]   Lot: [id number]   NDC: [phone]
